# Patient Record
Sex: Male | Born: 1950 | Race: White | Hispanic: No | Marital: Married
Health system: Southern US, Community
[De-identification: ages and names within clinical notes are randomized; demographics above are authoritative.]

---

## 2009-07-01 ENCOUNTER — Encounter: Admission: RE | Admit: 2009-07-01 | Discharge: 2009-07-01 | Payer: Self-pay | Admitting: Neurosurgery

## 2009-07-26 ENCOUNTER — Ambulatory Visit (HOSPITAL_COMMUNITY): Admission: RE | Admit: 2009-07-26 | Discharge: 2009-07-27 | Payer: Self-pay | Admitting: Neurosurgery

## 2011-02-06 LAB — BASIC METABOLIC PANEL
BUN: 16 mg/dL (ref 6–23)
Chloride: 103 mEq/L (ref 96–112)
Glucose, Bld: 110 mg/dL — ABNORMAL HIGH (ref 70–99)
Potassium: 4.7 mEq/L (ref 3.5–5.1)

## 2011-02-06 LAB — CBC
HCT: 49.7 % (ref 39.0–52.0)
MCHC: 34.1 g/dL (ref 30.0–36.0)
MCV: 89.7 fL (ref 78.0–100.0)
Platelets: 213 10*3/uL (ref 150–400)
RDW: 14 % (ref 11.5–15.5)

## 2011-04-29 IMAGING — RF DG MYELOGRAM CERVICAL
11 series · 11 of 11 positions shown · IV contrast (omnipaque)
Comparison: MRI of the cervical spine from [REDACTED]
06/14/2009.

CLINICAL DATA: Neck pain extending into the right shoulder.

MYELOGRAM INJECTION
TECHNIQUE: Informed consent was obtained from the patient prior to
the procedure, including potential complications of headache,
allergy, infection and pain.  A timeout procedure was performed.
With the patient prone, the lower back was prepped with Betadine.
1% Lidocaine was used for local anesthesia.  Lumbar puncture was
performed at the right paramidline L2-3 level using a 22 gauge
needle with return of clear CSF.  10 ml of Omnipaque 499was
injected into the subarachnoid space .
TECHNIQUE: Following injection of intrathecal Omnipaque contrast,
spine imaging in multiple projections was performed using
fluoroscopy.
Fluoroscopy Time: 3.4 minutes.
TECHNIQUE: CT imaging of the cervical spine was performed after
intrathecal contrast administration. Multiplanar CT image
reconstructions were also generated.

[Series 1: (hospital) · 1 of 1 slices shown]
[im 1/1]
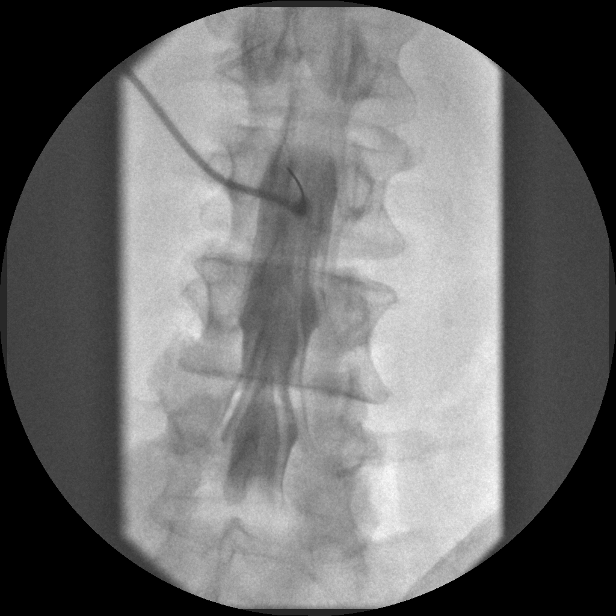

[Series 2: myelogram  white · 1 of 1 slices shown (1 of 10)]
[im 1/1]
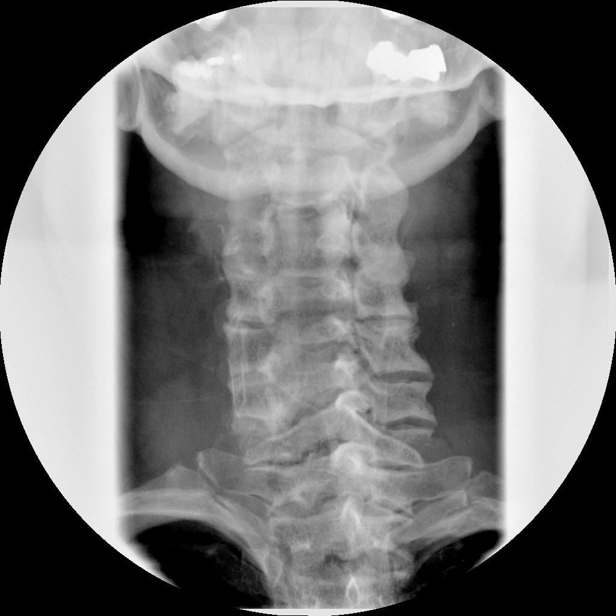

[Series 3: myelogram  white · 1 of 1 slices shown (2 of 10)]
[im 1/1]
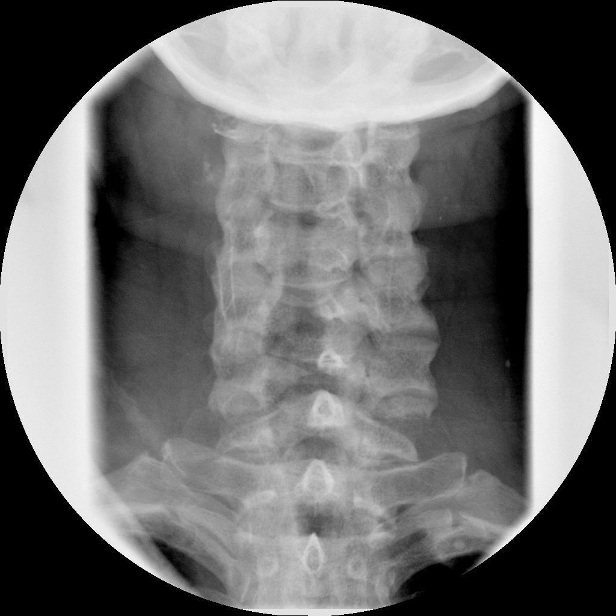

[Series 4: myelogram  white · 1 of 1 slices shown (3 of 10)]
[im 1/1]
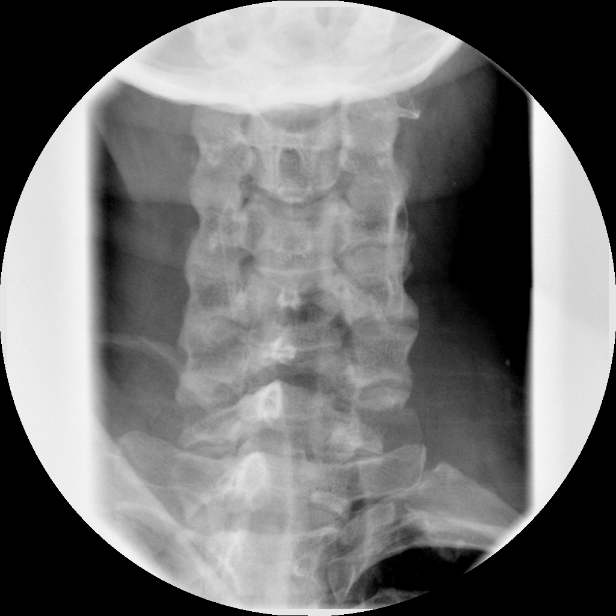

[Series 5: myelogram  white · 1 of 1 slices shown (4 of 10)]
[im 1/1]
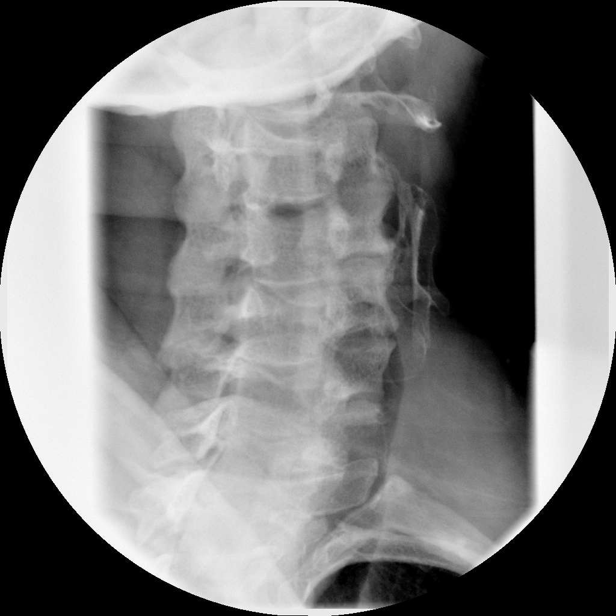

[Series 6: myelogram  white · 1 of 1 slices shown (5 of 10)]
[im 1/1]
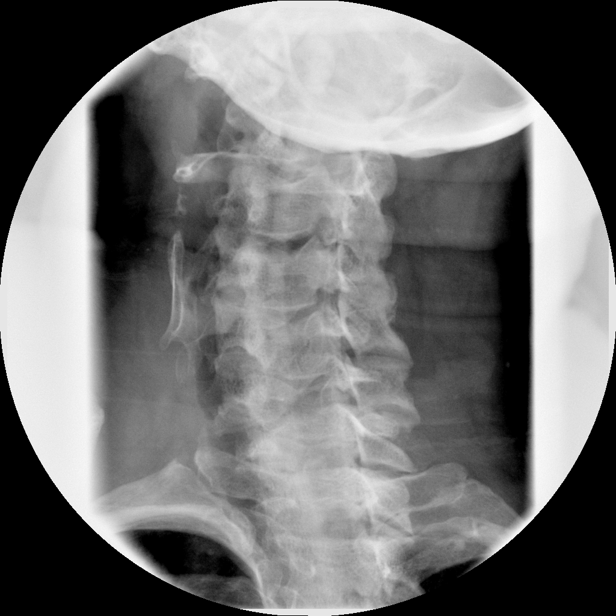

[Series 7: myelogram  white · 1 of 1 slices shown (6 of 10)]
[im 1/1]
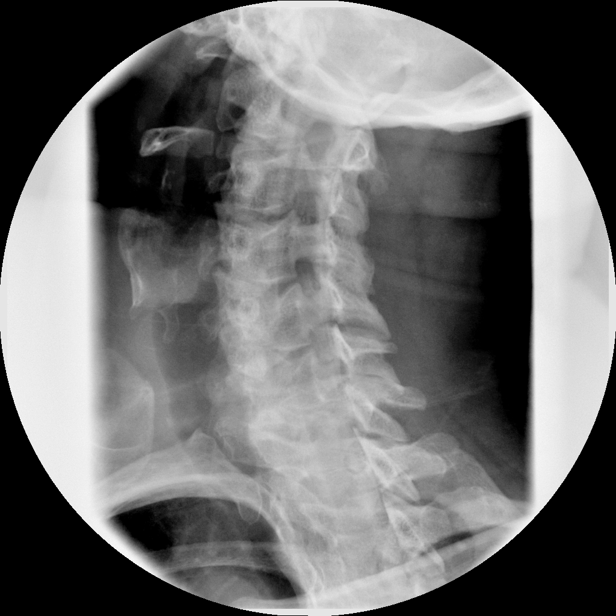

[Series 8: myelogram  white · 1 of 1 slices shown (7 of 10)]
[im 1/1]
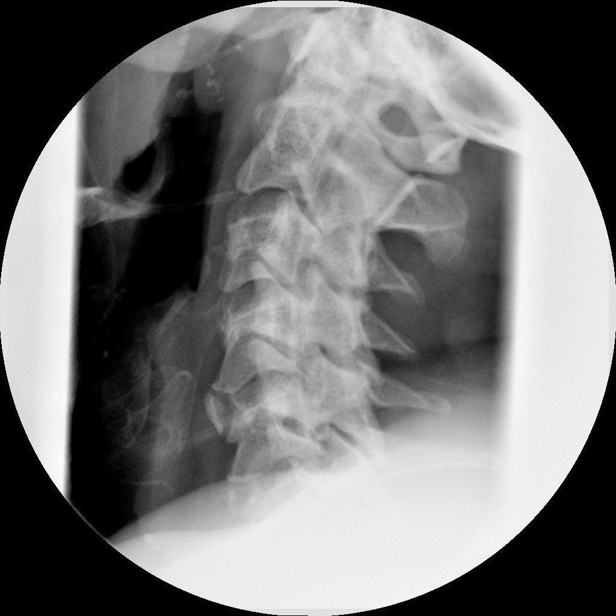

[Series 9: myelogram  white · 1 of 1 slices shown (8 of 10)]
[im 1/1]
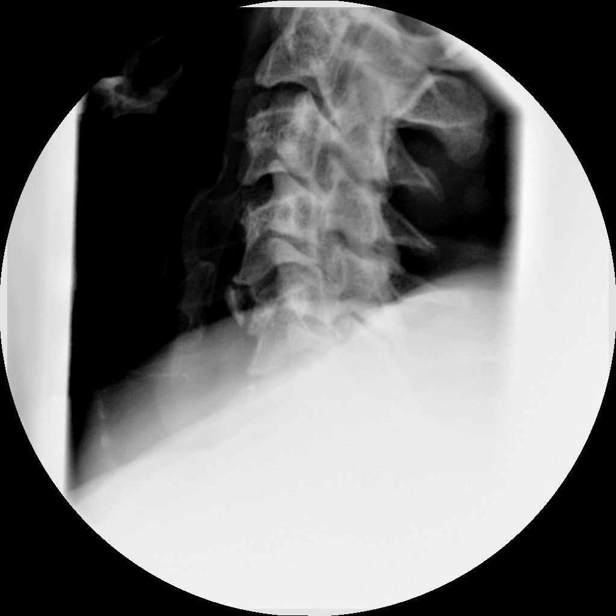

[Series 10: myelogram  white · 1 of 1 slices shown (9 of 10)]
[im 1/1]
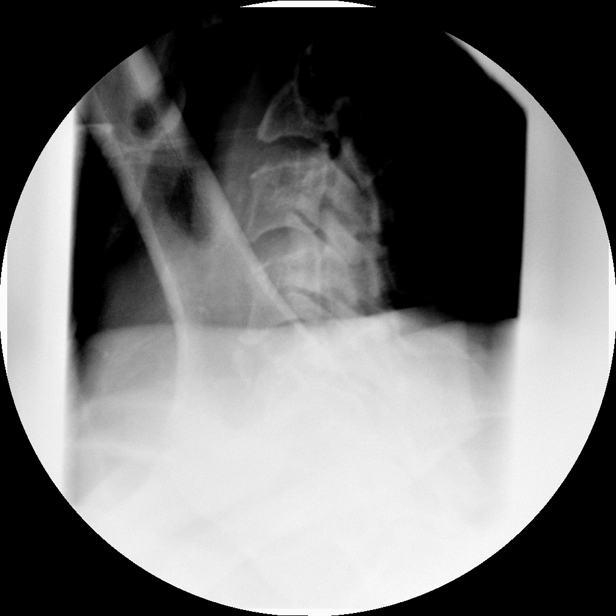

[Series 11: myelogram  white · 1 of 1 slices shown (10 of 10)]
[im 1/1]
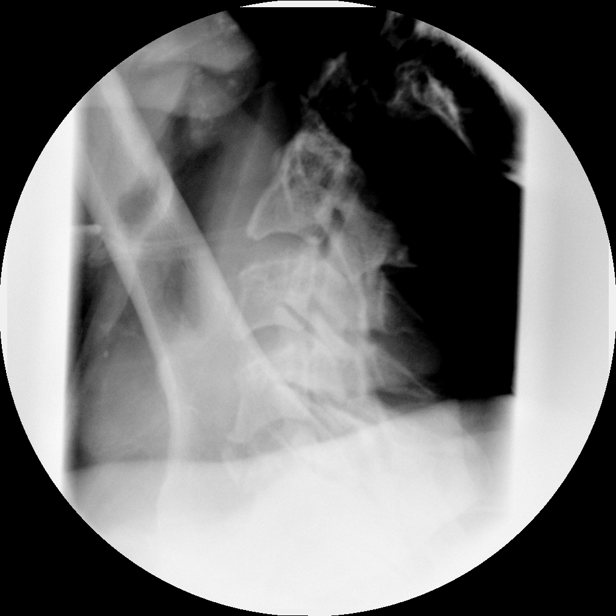

[11 of 11 positions shown; findings below may reference images not displayed]

IMPRESSION: Successful injection of  intrathecal contrast for myelography.

MYELOGRAM CERVICAL
FINDINGS: Contrast in the cervical spine is relatively faint.
There is a filling defect involving the left C5-6 foramen ,
corresponding to the far left lateral disc bulge seen on the MRI.
There is also a faint filling defect involving the right C6-7
foramen.  This could potentially effects the right C7 nerve root.
No other focal filling defects are present.
IMPRESSION: 1.  Left foraminal disease at C5-6.
2.  Question right foraminal disease at C6-7.

CT MYELOGRAPHY CERVICAL SPINE
FINDINGS: The cervical spine is imaged from the skull base through
T1.  The vertebral body heights and alignment are maintained.  The
craniocervical junction is unremarkable.  Individual disc levels
are as follows.

CT three:  Negative.

C3-4:  Mild right-sided facet hypertrophy is present.  There is no
significant stenosis.

C4-5:  There is mild asymmetric right-sided facet hypertrophy and
of uncovertebral disease without significant stenosis.

C5-6:  A left soft disc protrusion extends into the lateral recess
and left neural foramen, potentially affecting the left C6 nerve
root.  There is effacement of the ventral CSF on the left side of
the spinal canal.  The right foramen is patent.

C6-7:  There is soft tissue filling the right foramen, compatible
with a disc herniation.  There is asymmetric right-sided
uncovertebral disease as well.  This potentially affects the right
C7 nerve root.

C7-T1:  Negative.
IMPRESSION: 1.  Soft tissue filling the right C6-7 foramen, compatible with a
disc protrusion.  This may affect the right C7 nerve root.
2.  Left soft disc protrusion at C5-6 resulting in left neural
foraminal narrowing, potentially affecting the left C6 nerve root.
3.  Right-sided uncovertebral and facet disease at C3-4 and C4-5
without significant stenosis at either of those levels.

## 2016-12-13 DIAGNOSIS — S6991XA Unspecified injury of right wrist, hand and finger(s), initial encounter: Secondary | ICD-10-CM | POA: Diagnosis not present

## 2016-12-13 DIAGNOSIS — S61011A Laceration without foreign body of right thumb without damage to nail, initial encounter: Secondary | ICD-10-CM | POA: Diagnosis not present

## 2016-12-13 DIAGNOSIS — W312XXA Contact with powered woodworking and forming machines, initial encounter: Secondary | ICD-10-CM | POA: Diagnosis not present

## 2016-12-13 DIAGNOSIS — Z23 Encounter for immunization: Secondary | ICD-10-CM | POA: Diagnosis not present

## 2016-12-27 DIAGNOSIS — S6991XD Unspecified injury of right wrist, hand and finger(s), subsequent encounter: Secondary | ICD-10-CM | POA: Diagnosis not present

## 2017-05-25 ENCOUNTER — Other Ambulatory Visit: Payer: Self-pay | Admitting: Pharmacy Technician

## 2017-05-25 NOTE — Patient Outreach (Signed)
[facility]) Care Management  05/25/2017  Bryan Richards 11-17-1950 [id number]  Contacted patient in reference to medication adherence for [employer]. The medication's in question are Atorvastatin 20 mg and Enalapril 20 mg. Due to the patient's mailbox being full I was unable to leave a message at this time. I will try to contact the patient again at a later date.  Doreene Burke, [place] [phone]

## 2017-07-02 DIAGNOSIS — Z23 Encounter for immunization: Secondary | ICD-10-CM | POA: Diagnosis not present

## 2017-07-02 DIAGNOSIS — R7303 Prediabetes: Secondary | ICD-10-CM | POA: Diagnosis not present

## 2017-07-02 DIAGNOSIS — E782 Mixed hyperlipidemia: Secondary | ICD-10-CM | POA: Diagnosis not present

## 2017-07-02 DIAGNOSIS — Z125 Encounter for screening for malignant neoplasm of prostate: Secondary | ICD-10-CM | POA: Diagnosis not present

## 2017-07-02 DIAGNOSIS — Z0000 Encounter for general adult medical examination without abnormal findings: Secondary | ICD-10-CM | POA: Diagnosis not present

## 2017-07-02 DIAGNOSIS — M159 Polyosteoarthritis, unspecified: Secondary | ICD-10-CM | POA: Diagnosis not present

## 2017-07-02 DIAGNOSIS — N183 Chronic kidney disease, stage 3 (moderate): Secondary | ICD-10-CM | POA: Diagnosis not present

## 2017-07-02 DIAGNOSIS — Z1159 Encounter for screening for other viral diseases: Secondary | ICD-10-CM | POA: Diagnosis not present

## 2017-07-02 DIAGNOSIS — I129 Hypertensive chronic kidney disease with stage 1 through stage 4 chronic kidney disease, or unspecified chronic kidney disease: Secondary | ICD-10-CM | POA: Diagnosis not present

## 2017-07-08 DIAGNOSIS — Z87891 Personal history of nicotine dependence: Secondary | ICD-10-CM | POA: Diagnosis not present

## 2017-07-08 DIAGNOSIS — N189 Chronic kidney disease, unspecified: Secondary | ICD-10-CM | POA: Diagnosis not present

## 2017-09-07 DIAGNOSIS — L570 Actinic keratosis: Secondary | ICD-10-CM | POA: Diagnosis not present

## 2017-09-07 DIAGNOSIS — D0461 Carcinoma in situ of skin of right upper limb, including shoulder: Secondary | ICD-10-CM | POA: Diagnosis not present

## 2017-09-09 DIAGNOSIS — K644 Residual hemorrhoidal skin tags: Secondary | ICD-10-CM | POA: Diagnosis not present

## 2017-09-09 DIAGNOSIS — D122 Benign neoplasm of ascending colon: Secondary | ICD-10-CM | POA: Diagnosis not present

## 2017-09-09 DIAGNOSIS — K648 Other hemorrhoids: Secondary | ICD-10-CM | POA: Diagnosis not present

## 2017-09-09 DIAGNOSIS — K5730 Diverticulosis of large intestine without perforation or abscess without bleeding: Secondary | ICD-10-CM | POA: Diagnosis not present

## 2017-09-09 DIAGNOSIS — Z1211 Encounter for screening for malignant neoplasm of colon: Secondary | ICD-10-CM | POA: Diagnosis not present

## 2017-11-27 DIAGNOSIS — S61211A Laceration without foreign body of left index finger without damage to nail, initial encounter: Secondary | ICD-10-CM | POA: Diagnosis not present

## 2017-11-29 DIAGNOSIS — S61211D Laceration without foreign body of left index finger without damage to nail, subsequent encounter: Secondary | ICD-10-CM | POA: Diagnosis not present

## 2017-12-28 DIAGNOSIS — E782 Mixed hyperlipidemia: Secondary | ICD-10-CM | POA: Diagnosis not present

## 2017-12-28 DIAGNOSIS — R7303 Prediabetes: Secondary | ICD-10-CM | POA: Diagnosis not present

## 2017-12-30 DIAGNOSIS — I129 Hypertensive chronic kidney disease with stage 1 through stage 4 chronic kidney disease, or unspecified chronic kidney disease: Secondary | ICD-10-CM | POA: Diagnosis not present

## 2017-12-30 DIAGNOSIS — E782 Mixed hyperlipidemia: Secondary | ICD-10-CM | POA: Diagnosis not present

## 2017-12-30 DIAGNOSIS — N183 Chronic kidney disease, stage 3 (moderate): Secondary | ICD-10-CM | POA: Diagnosis not present

## 2017-12-30 DIAGNOSIS — R7303 Prediabetes: Secondary | ICD-10-CM | POA: Diagnosis not present

## 2018-01-06 DIAGNOSIS — L84 Corns and callosities: Secondary | ICD-10-CM | POA: Diagnosis not present

## 2018-01-06 DIAGNOSIS — Z08 Encounter for follow-up examination after completed treatment for malignant neoplasm: Secondary | ICD-10-CM | POA: Diagnosis not present

## 2018-01-06 DIAGNOSIS — Z85828 Personal history of other malignant neoplasm of skin: Secondary | ICD-10-CM | POA: Diagnosis not present

## 2018-01-06 DIAGNOSIS — L570 Actinic keratosis: Secondary | ICD-10-CM | POA: Diagnosis not present

## 2018-01-06 DIAGNOSIS — L738 Other specified follicular disorders: Secondary | ICD-10-CM | POA: Diagnosis not present

## 2018-01-06 DIAGNOSIS — L821 Other seborrheic keratosis: Secondary | ICD-10-CM | POA: Diagnosis not present

## 2018-01-06 DIAGNOSIS — L578 Other skin changes due to chronic exposure to nonionizing radiation: Secondary | ICD-10-CM | POA: Diagnosis not present

## 2018-02-03 DIAGNOSIS — J069 Acute upper respiratory infection, unspecified: Secondary | ICD-10-CM | POA: Diagnosis not present

## 2018-10-03 DIAGNOSIS — E782 Mixed hyperlipidemia: Secondary | ICD-10-CM | POA: Diagnosis not present

## 2018-10-03 DIAGNOSIS — Z23 Encounter for immunization: Secondary | ICD-10-CM | POA: Diagnosis not present

## 2018-10-03 DIAGNOSIS — M159 Polyosteoarthritis, unspecified: Secondary | ICD-10-CM | POA: Diagnosis not present

## 2018-10-03 DIAGNOSIS — Z0000 Encounter for general adult medical examination without abnormal findings: Secondary | ICD-10-CM | POA: Diagnosis not present

## 2018-10-03 DIAGNOSIS — N183 Chronic kidney disease, stage 3 (moderate): Secondary | ICD-10-CM | POA: Diagnosis not present

## 2018-10-03 DIAGNOSIS — Z125 Encounter for screening for malignant neoplasm of prostate: Secondary | ICD-10-CM | POA: Diagnosis not present

## 2018-10-03 DIAGNOSIS — I700 Atherosclerosis of aorta: Secondary | ICD-10-CM | POA: Diagnosis not present

## 2018-10-03 DIAGNOSIS — I129 Hypertensive chronic kidney disease with stage 1 through stage 4 chronic kidney disease, or unspecified chronic kidney disease: Secondary | ICD-10-CM | POA: Diagnosis not present

## 2018-10-03 DIAGNOSIS — R7303 Prediabetes: Secondary | ICD-10-CM | POA: Diagnosis not present

## 2018-10-03 DIAGNOSIS — F17290 Nicotine dependence, other tobacco product, uncomplicated: Secondary | ICD-10-CM | POA: Diagnosis not present

## 2019-07-13 DIAGNOSIS — Z08 Encounter for follow-up examination after completed treatment for malignant neoplasm: Secondary | ICD-10-CM | POA: Diagnosis not present

## 2019-07-13 DIAGNOSIS — L821 Other seborrheic keratosis: Secondary | ICD-10-CM | POA: Diagnosis not present

## 2019-07-13 DIAGNOSIS — L570 Actinic keratosis: Secondary | ICD-10-CM | POA: Diagnosis not present

## 2019-07-13 DIAGNOSIS — L578 Other skin changes due to chronic exposure to nonionizing radiation: Secondary | ICD-10-CM | POA: Diagnosis not present

## 2019-07-13 DIAGNOSIS — Z85828 Personal history of other malignant neoplasm of skin: Secondary | ICD-10-CM | POA: Diagnosis not present

## 2019-09-21 DIAGNOSIS — L570 Actinic keratosis: Secondary | ICD-10-CM | POA: Diagnosis not present

## 2019-11-16 DIAGNOSIS — R05 Cough: Secondary | ICD-10-CM | POA: Diagnosis not present

## 2019-11-16 DIAGNOSIS — R7303 Prediabetes: Secondary | ICD-10-CM | POA: Diagnosis not present

## 2019-11-16 DIAGNOSIS — F17290 Nicotine dependence, other tobacco product, uncomplicated: Secondary | ICD-10-CM | POA: Diagnosis not present

## 2019-11-16 DIAGNOSIS — Z0000 Encounter for general adult medical examination without abnormal findings: Secondary | ICD-10-CM | POA: Diagnosis not present

## 2019-11-16 DIAGNOSIS — N183 Chronic kidney disease, stage 3 unspecified: Secondary | ICD-10-CM | POA: Diagnosis not present

## 2019-11-16 DIAGNOSIS — M159 Polyosteoarthritis, unspecified: Secondary | ICD-10-CM | POA: Diagnosis not present

## 2019-11-16 DIAGNOSIS — Z20822 Contact with and (suspected) exposure to covid-19: Secondary | ICD-10-CM | POA: Diagnosis not present

## 2019-11-16 DIAGNOSIS — I129 Hypertensive chronic kidney disease with stage 1 through stage 4 chronic kidney disease, or unspecified chronic kidney disease: Secondary | ICD-10-CM | POA: Diagnosis not present

## 2019-11-16 DIAGNOSIS — E782 Mixed hyperlipidemia: Secondary | ICD-10-CM | POA: Diagnosis not present

## 2019-11-16 DIAGNOSIS — I700 Atherosclerosis of aorta: Secondary | ICD-10-CM | POA: Diagnosis not present

## 2019-11-16 DIAGNOSIS — Z125 Encounter for screening for malignant neoplasm of prostate: Secondary | ICD-10-CM | POA: Diagnosis not present

## 2020-03-15 DIAGNOSIS — N183 Chronic kidney disease, stage 3 unspecified: Secondary | ICD-10-CM | POA: Diagnosis not present

## 2020-03-15 DIAGNOSIS — Z20822 Contact with and (suspected) exposure to covid-19: Secondary | ICD-10-CM | POA: Diagnosis not present

## 2020-03-15 DIAGNOSIS — I129 Hypertensive chronic kidney disease with stage 1 through stage 4 chronic kidney disease, or unspecified chronic kidney disease: Secondary | ICD-10-CM | POA: Diagnosis not present

## 2020-03-15 DIAGNOSIS — R12 Heartburn: Secondary | ICD-10-CM | POA: Diagnosis not present

## 2020-03-15 DIAGNOSIS — J029 Acute pharyngitis, unspecified: Secondary | ICD-10-CM | POA: Diagnosis not present

## 2020-03-15 DIAGNOSIS — R05 Cough: Secondary | ICD-10-CM | POA: Diagnosis not present

## 2020-03-21 DIAGNOSIS — L84 Corns and callosities: Secondary | ICD-10-CM | POA: Diagnosis not present

## 2020-03-21 DIAGNOSIS — D692 Other nonthrombocytopenic purpura: Secondary | ICD-10-CM | POA: Diagnosis not present

## 2020-03-21 DIAGNOSIS — D1801 Hemangioma of skin and subcutaneous tissue: Secondary | ICD-10-CM | POA: Diagnosis not present

## 2020-03-21 DIAGNOSIS — L578 Other skin changes due to chronic exposure to nonionizing radiation: Secondary | ICD-10-CM | POA: Diagnosis not present

## 2020-03-21 DIAGNOSIS — L570 Actinic keratosis: Secondary | ICD-10-CM | POA: Diagnosis not present

## 2020-03-21 DIAGNOSIS — L905 Scar conditions and fibrosis of skin: Secondary | ICD-10-CM | POA: Diagnosis not present

## 2020-03-21 DIAGNOSIS — L814 Other melanin hyperpigmentation: Secondary | ICD-10-CM | POA: Diagnosis not present

## 2020-03-21 DIAGNOSIS — X32XXXS Exposure to sunlight, sequela: Secondary | ICD-10-CM | POA: Diagnosis not present

## 2020-04-13 DIAGNOSIS — L989 Disorder of the skin and subcutaneous tissue, unspecified: Secondary | ICD-10-CM | POA: Diagnosis not present

## 2020-05-01 DIAGNOSIS — M79642 Pain in left hand: Secondary | ICD-10-CM | POA: Diagnosis not present

## 2020-05-01 DIAGNOSIS — M67442 Ganglion, left hand: Secondary | ICD-10-CM | POA: Diagnosis not present

## 2020-05-01 DIAGNOSIS — L989 Disorder of the skin and subcutaneous tissue, unspecified: Secondary | ICD-10-CM | POA: Diagnosis not present

## 2020-05-07 DIAGNOSIS — M7989 Other specified soft tissue disorders: Secondary | ICD-10-CM | POA: Diagnosis not present

## 2020-05-07 DIAGNOSIS — M67442 Ganglion, left hand: Secondary | ICD-10-CM | POA: Diagnosis not present

## 2020-12-13 DIAGNOSIS — Z03818 Encounter for observation for suspected exposure to other biological agents ruled out: Secondary | ICD-10-CM | POA: Diagnosis not present

## 2020-12-13 DIAGNOSIS — Z0130 Encounter for examination of blood pressure without abnormal findings: Secondary | ICD-10-CM | POA: Diagnosis not present

## 2020-12-13 DIAGNOSIS — J00 Acute nasopharyngitis [common cold]: Secondary | ICD-10-CM | POA: Diagnosis not present

## 2020-12-13 DIAGNOSIS — R059 Cough, unspecified: Secondary | ICD-10-CM | POA: Diagnosis not present

## 2020-12-17 DIAGNOSIS — R051 Acute cough: Secondary | ICD-10-CM | POA: Diagnosis not present

## 2020-12-17 DIAGNOSIS — H6691 Otitis media, unspecified, right ear: Secondary | ICD-10-CM | POA: Diagnosis not present

## 2021-01-20 DIAGNOSIS — I700 Atherosclerosis of aorta: Secondary | ICD-10-CM | POA: Diagnosis not present

## 2021-01-20 DIAGNOSIS — M159 Polyosteoarthritis, unspecified: Secondary | ICD-10-CM | POA: Diagnosis not present

## 2021-01-20 DIAGNOSIS — E782 Mixed hyperlipidemia: Secondary | ICD-10-CM | POA: Diagnosis not present

## 2021-01-20 DIAGNOSIS — N183 Chronic kidney disease, stage 3 unspecified: Secondary | ICD-10-CM | POA: Diagnosis not present

## 2021-01-20 DIAGNOSIS — Z0000 Encounter for general adult medical examination without abnormal findings: Secondary | ICD-10-CM | POA: Diagnosis not present

## 2021-01-20 DIAGNOSIS — R7303 Prediabetes: Secondary | ICD-10-CM | POA: Diagnosis not present

## 2021-01-20 DIAGNOSIS — Z125 Encounter for screening for malignant neoplasm of prostate: Secondary | ICD-10-CM | POA: Diagnosis not present

## 2021-01-20 DIAGNOSIS — H6501 Acute serous otitis media, right ear: Secondary | ICD-10-CM | POA: Diagnosis not present

## 2021-01-20 DIAGNOSIS — I129 Hypertensive chronic kidney disease with stage 1 through stage 4 chronic kidney disease, or unspecified chronic kidney disease: Secondary | ICD-10-CM | POA: Diagnosis not present

## 2021-04-16 DIAGNOSIS — K089 Disorder of teeth and supporting structures, unspecified: Secondary | ICD-10-CM | POA: Diagnosis not present

## 2021-04-16 DIAGNOSIS — I10 Essential (primary) hypertension: Secondary | ICD-10-CM | POA: Diagnosis not present
